# Patient Record
Sex: Male | Born: 1955 | Race: White | Hispanic: No | Marital: Married | Smoking: Never smoker
Health system: Southern US, Community
[De-identification: ages and names within clinical notes are randomized; demographics above are authoritative.]

## PROBLEM LIST (undated history)

## (undated) HISTORY — PX: FRACTURE SURGERY: SHX138

---

## 2015-03-31 ENCOUNTER — Emergency Department (HOSPITAL_COMMUNITY): Payer: Self-pay

## 2015-03-31 ENCOUNTER — Emergency Department (HOSPITAL_COMMUNITY)
Admission: EM | Admit: 2015-03-31 | Discharge: 2015-04-01 | Disposition: A | Discharge status: Home / Self Care | Payer: Self-pay | Attending: Emergency Medicine | Admitting: Emergency Medicine

## 2015-03-31 ENCOUNTER — Encounter (HOSPITAL_COMMUNITY): Payer: Self-pay | Admitting: Emergency Medicine

## 2015-03-31 DIAGNOSIS — R319 Hematuria, unspecified: Secondary | ICD-10-CM

## 2015-03-31 DIAGNOSIS — K5900 Constipation, unspecified: Secondary | ICD-10-CM | POA: Insufficient documentation

## 2015-03-31 DIAGNOSIS — N3001 Acute cystitis with hematuria: Secondary | ICD-10-CM | POA: Insufficient documentation

## 2015-03-31 LAB — URINALYSIS, ROUTINE W REFLEX MICROSCOPIC
GLUCOSE, UA: NEGATIVE mg/dL
KETONES UR: 40 mg/dL — AB
Nitrite: NEGATIVE
PH: 7 (ref 5.0–8.0)
Protein, ur: 100 mg/dL — AB
SPECIFIC GRAVITY, URINE: 1.023 (ref 1.005–1.030)

## 2015-03-31 LAB — BASIC METABOLIC PANEL
Anion gap: 13 (ref 5–15)
BUN: 14 mg/dL (ref 6–20)
CHLORIDE: 98 mmol/L — AB (ref 101–111)
CO2: 24 mmol/L (ref 22–32)
CREATININE: 1.16 mg/dL (ref 0.61–1.24)
Calcium: 9.9 mg/dL (ref 8.9–10.3)
GFR calc Af Amer: >60 mL/min (ref >60)
GLUCOSE: 156 mg/dL — AB (ref 65–99)
POTASSIUM: 4 mmol/L (ref 3.5–5.1)
SODIUM: 135 mmol/L (ref 135–145)

## 2015-03-31 LAB — CBC
HCT: 48.1 % (ref 39.0–52.0)
Hemoglobin: 16.9 g/dL (ref 13.0–17.0)
MCH: 33.2 pg (ref 26.0–34.0)
MCHC: 35.1 g/dL (ref 30.0–36.0)
MCV: 94.5 fL (ref 78.0–100.0)
PLATELETS: 163 10*3/uL (ref 150–400)
RBC: 5.09 MIL/uL (ref 4.22–5.81)
RDW: 13 % (ref 11.5–15.5)
WBC: 24.8 10*3/uL — ABNORMAL HIGH (ref 4.0–10.5)

## 2015-03-31 LAB — URINE MICROSCOPIC-ADD ON: SQUAMOUS EPITHELIAL / LPF: NONE SEEN

## 2015-03-31 MED ORDER — CEPHALEXIN 500 MG PO CAPS: 500.0000 mg | ORAL_CAPSULE | Freq: Three times a day (TID) | ORAL | Status: AC

## 2015-03-31 MED ORDER — DEXTROSE 5 % IV SOLN
1.0000 g | Freq: Once | INTRAVENOUS | Status: AC
  Administered 2015-03-31: 1 g via INTRAVENOUS
  Filled 2015-03-31: qty 10

## 2015-03-31 MED ORDER — SODIUM CHLORIDE 0.9 % IV BOLUS (SEPSIS)
1000.0000 mL | Freq: Once | INTRAVENOUS | Status: AC
  Administered 2015-03-31: 1000 mL via INTRAVENOUS

## 2015-03-31 NOTE — ED Notes (Addendum)
Pt from [place] 1 day in the states. Hx of UTI. Started having dysuria yesterday and was not feeling well. Pt reports blood in his urine today with pain. Pt has n/v. Pt unsure if he has had fever or diarrhea. Last BM was Friday.

## 2015-03-31 NOTE — ED Provider Notes (Signed)
CSN: [id number]     Arrival date & time 03/31/15  2049 History   First MD Initiated Contact with Patient 03/31/15 2213     Chief Complaint  Patient presents with  . Nausea  . Emesis  . Hematuria    HPI   Lucas Kelley is a 60 y.o. male with a PMH of UTI who presents to the ED with dysuria, urgency, frequency, hematuria, and generalized body aches, which he states started yesterday and has been constant since that time. He denies exacerbating factors. He states he took 2 augmentin at home for symptom relief. He reports associated subjective fever, nausea, 2 episodes of emesis, and constipation (last BM 2 days ago). Denies abdominal pain, hematemesis, hematochezia, melena, penile pain/swelling/discharge, testicular pain/swelling.   History reviewed. No pertinent past medical history. Past Surgical History  Procedure Laterality Date  . Fracture surgery     No family history on file. Social History  Substance Use Topics  . Smoking status: Never Smoker   . Smokeless tobacco: None  . Alcohol Use: Yes     Review of Systems  Constitutional: Positive for fever. Negative for chills.  HENT: Negative for congestion.   Respiratory: Negative for cough and shortness of breath.   Cardiovascular: Negative for chest pain.  Gastrointestinal: Positive for nausea, vomiting and constipation. Negative for abdominal pain, diarrhea and blood in stool.  Genitourinary: Positive for dysuria, urgency, frequency and hematuria. Negative for flank pain, discharge, penile swelling, scrotal swelling, penile pain and testicular pain.  Musculoskeletal: Positive for myalgias and arthralgias.  All other systems reviewed and are negative.     Allergies  Review of patient's allergies indicates no known allergies.  Home Medications   Prior to Admission medications   Medication Sig Start Date End Date Taking? Authorizing Provider  amoxicillin-clavulanate (AUGMENTIN XR) 1000-62.5 MG 12 hr tablet Take 2 tablets  by mouth 2 (two) times daily. Patient is from [place] and buys this OTC so there is no specific amount of time he is to take,but he started today 03/31/2015.   Yes Historical Provider, MD  cephALEXin (KEFLEX) 500 MG capsule Take 1 capsule (500 mg total) by mouth 3 (three) times daily. 03/31/15   Mady Gemma, PA-C  ibuprofen (ADVIL,MOTRIN) 200 MG tablet Take 400 mg by mouth every 6 (six) hours as needed for moderate pain.   Yes Historical Provider, MD  pseudoephedrine-acetaminophen (TYLENOL SINUS) 30-500 MG TABS tablet Take 2 tablets by mouth every 4 (four) hours as needed (for cold/flu).   Yes Historical Provider, MD    BP 139/90 mmHg  Pulse 116  Temp(Src) 98.4 F (36.9 C) (Oral)  Resp 20  SpO2 99% Physical Exam  Constitutional: He is oriented to person, place, and time. He appears well-developed and well-nourished. No distress.  HENT:  Head: Normocephalic and atraumatic.  Right Ear: External ear normal.  Left Ear: External ear normal.  Nose: Nose normal.  Mouth/Throat: Uvula is midline, oropharynx is clear and moist and mucous membranes are normal.  Eyes: Conjunctivae, EOM and lids are normal. Pupils are equal, round, and reactive to light. Right eye exhibits no discharge. Left eye exhibits no discharge. No scleral icterus.  Neck: Normal range of motion. Neck supple.  Cardiovascular: Normal rate, regular rhythm, normal heart sounds, intact distal pulses and normal pulses.   Pulmonary/Chest: Effort normal and breath sounds normal. No respiratory distress. He has no wheezes. He has no rales.  Abdominal: Soft. Normal appearance and bowel sounds are normal. He exhibits no distension  and no mass. There is no tenderness. There is no rigidity, no rebound, no guarding and no CVA tenderness.  Musculoskeletal: Normal range of motion. He exhibits no edema or tenderness.  Neurological: He is alert and oriented to person, place, and time.  Skin: Skin is warm, dry and intact. No rash noted.  He is not diaphoretic. No erythema. No pallor.  Psychiatric: He has a normal mood and affect. His speech is normal and behavior is normal.  Nursing note and vitals reviewed.   ED Course  Procedures (including critical care time)  Labs Review Labs Reviewed  URINALYSIS, ROUTINE W REFLEX MICROSCOPIC (NOT AT [facility]) - Abnormal; Notable for the following:    Color, Urine AMBER (*)    APPearance CLOUDY (*)    Hgb urine dipstick SMALL (*)    Bilirubin Urine SMALL (*)    Ketones, ur 40 (*)    Protein, ur 100 (*)    Leukocytes, UA LARGE (*)    All other components within normal limits  CBC - Abnormal; Notable for the following:    WBC 24.8 (*)    All other components within normal limits  BASIC METABOLIC PANEL - Abnormal; Notable for the following:    Chloride 98 (*)    Glucose, Bld 156 (*)    All other components within normal limits  URINE MICROSCOPIC-ADD ON - Abnormal; Notable for the following:    Bacteria, UA FEW (*)    All other components within normal limits  URINE CULTURE    Imaging Review Ct Abdomen Pelvis Wo Contrast  03/31/2015  CLINICAL DATA:  Dysuria beginning yesterday. Blood in the urine with pain. Nausea and vomiting. EXAM: CT ABDOMEN AND PELVIS WITHOUT CONTRAST TECHNIQUE: Multidetector CT imaging of the abdomen and pelvis was performed following the standard protocol without IV contrast. COMPARISON:  None. FINDINGS: Slight fibrosis in the lung bases. Kidneys appear symmetrical in size and shape. No hydronephrosis or hydroureter. No renal, ureteral, or bladder stones. Mild bladder wall thickening may indicate cystitis. Mild diffuse fatty infiltration of the liver. Cholelithiasis without inflammatory change around the gallbladder. Unenhanced appearance of the pancreas, spleen, adrenal glands, inferior vena cava, and retroperitoneal lymph nodes is unremarkable. Calcification of the abdominal aorta without aneurysm. Stomach, small bowel, and colon are not abnormally distended. No  free air or free fluid in the abdomen. Abdominal wall musculature appears intact. Pelvis: Whole appendix is normal. Prostate gland is enlarged measuring 5.4 cm transverse diameter. No free or loculated pelvic fluid collections. No pelvic mass or lymphadenopathy. Mild stranding in the lower pelvic fat particularly on the left may indicate infectious or inflammatory change. Degenerative changes in the spine. IMPRESSION: No renal or ureteral stone or obstruction. Bladder wall thickening may indicate cystitis. Infiltration in the low pelvic fat may be due to infectious or inflammatory process. Prostate enlargement. Diffuse fatty infiltration of the liver. Cholelithiasis. Electronically Signed   By: Burman Nieves M.D.   On: 03/31/2015 23:37     I have personally reviewed and evaluated these images and lab results as part of my medical decision-making.   EKG Interpretation None      MDM   Final diagnoses:  Hematuria  Acute cystitis with hematuria    60 year old male presents with dysuria, urgency, frequency, hematuria, and generalized body aches since yesterday. Reports associated subjective fever, nausea, 2 episodes of emesis, and constipation (last BM 2 days ago). Denies abdominal pain, hematemesis, hematochezia, melena, penile pain/swelling/discharge, testicular pain/swelling.  Patient is afebrile. Tachycardic to  116. Abdomen soft, non-tender, non-distended. No rebound, guarding, or masses. No CVA tenderness.  CBC remarkable for leukocytosis of 24.8. BMP unremarkable, creatinine within normal limits. UA remarkable for large leukocytes, small hemoglobin. Urine microscopic remarkable for TNTC WBC, few bacteria. Urine culture ordered. CT abdomen pelvis negative for renal or ureteral stone or obstruction, bladder wall thickening may indicate cystitis.  Given fluids and rocephin in the ED. Patient discussed with and seen by Dr. Hyacinth Meeker. Recommended admission, however patient refused, as he states he  is flying home to [place] tomorrow. Patient is non-toxic and well-appearing, feel he is stable for discharge at this time. Will treat with keflex. Patient understands his urine culture is pending and that he should schedule an appointment to follow-up with urology. Return precautions discussed. Patient verbalizes his understanding and is in agreement with plan.  BP 139/90 mmHg  Pulse 116  Temp(Src) 98.4 F (36.9 C) (Oral)  Resp 20  SpO2 99%    Mady Gemma, PA-C 04/01/15 0026  Eber Hong, MD 04/01/15 1331

## 2015-03-31 NOTE — ED Provider Notes (Signed)
The patient is a 60 year old male, he has a history of a couple of urinary infections in the past, states that he started having some dysuria yesterday, he was not feeling well I can't the flu with body aches and fevers. He presents here after having nausea and vomiting today. On exam the patient has a soft abdomen, totally nontender, his heart rate is now close to 100 though he was initially tachycardic. His lower extremity is without edema, his heart and lung sounds are normal, his mental status is totally normal. Labs reviewed, UTI present, white blood cells significantly elevated at 25,000. I have this conversation with the patient regarding his prior visits, prior UTI and prior leukocytosis she states was very similar. He does not appear toxic, I have offered him and recommended admission overnight but he has declined stating that he did travel back to [place] - I admitted the patient aware of the urinary culture, however works and how he will get follow-up if he needs it for medicine, will prescribe Keflex. He received Rocephin and fluids in the emergency department and states he feels 100% better.  In addition to written d/c instructions, the pt was given verbal d/c instructions including the indications for return and expressed understanding to the instructions.  Medical screening examination/treatment/procedure(s) were conducted as a shared visit with non-physician practitioner(s) and myself.  I personally evaluated the patient during the encounter.  Clinical Impression:   Final diagnoses:  Hematuria  Acute cystitis with hematuria         Eber Hong, MD 04/01/15 1331

## 2015-03-31 NOTE — Discharge Instructions (Signed)
1. Medications: keflex, usual home medications 2. Treatment: rest, drink plenty of fluids 3. Follow Up: please followup with urology for discussion of your diagnoses and further evaluation after today's visit; if you do not have a primary care doctor use the resource guide provided to find one; please return to the ER for high fever, severe pain, new or worsening symptoms   Urinary Tract Infection Urinary tract infections (UTIs) can develop anywhere along your urinary tract. Your urinary tract is your body's drainage system for removing wastes and extra water. Your urinary tract includes two kidneys, two ureters, a bladder, and a urethra. Your kidneys are a pair of bean-shaped organs. Each kidney is about the size of your fist. They are located below your ribs, one on each side of your spine. CAUSES Infections are caused by microbes, which are microscopic organisms, including fungi, viruses, and bacteria. These organisms are so small that they can only be seen through a microscope. Bacteria are the microbes that most commonly cause UTIs. SYMPTOMS  Symptoms of UTIs may vary by age and gender of the patient and by the location of the infection. Symptoms in young women typically include a frequent and intense urge to urinate and a painful, burning feeling in the bladder or urethra during urination. Older women and men are more likely to be tired, shaky, and weak and have muscle aches and abdominal pain. A fever may mean the infection is in your kidneys. Other symptoms of a kidney infection include pain in your back or sides below the ribs, nausea, and vomiting. DIAGNOSIS To diagnose a UTI, your caregiver will ask you about your symptoms. Your caregiver will also ask you to provide a urine sample. The urine sample will be tested for bacteria and white blood cells. White blood cells are made by your body to help fight infection. TREATMENT  Typically, UTIs can be treated with medication. Because most UTIs  are caused by a bacterial infection, they usually can be treated with the use of antibiotics. The choice of antibiotic and length of treatment depend on your symptoms and the type of bacteria causing your infection. HOME CARE INSTRUCTIONS  If you were prescribed antibiotics, take them exactly as your caregiver instructs you. Finish the medication even if you feel better after you have only taken some of the medication.  Drink enough water and fluids to keep your urine clear or pale yellow.  Avoid caffeine, tea, and carbonated beverages. They tend to irritate your bladder.  Empty your bladder often. Avoid holding urine for long periods of time.  Empty your bladder before and after sexual intercourse.  After a bowel movement, women should cleanse from front to back. Use each tissue only once. SEEK MEDICAL CARE IF:   You have back pain.  You develop a fever.  Your symptoms do not begin to resolve within 3 days. SEEK IMMEDIATE MEDICAL CARE IF:   You have severe back pain or lower abdominal pain.  You develop chills.  You have nausea or vomiting.  You have continued burning or discomfort with urination. MAKE SURE YOU:   Understand these instructions.  Will watch your condition.  Will get help right away if you are not doing well or get worse.   This information is not intended to replace advice given to you by your health care provider. Make sure you discuss any questions you have with your health care provider.   Document Released: 12/03/2004 Document Revised: 11/14/2014 Document Reviewed: 04/03/2011 Elsevier Interactive Patient Education  2016 [place].

## 2015-04-03 LAB — URINE CULTURE: Culture: 30000

## 2015-04-04 ENCOUNTER — Telehealth (HOSPITAL_BASED_OUTPATIENT_CLINIC_OR_DEPARTMENT_OTHER): Payer: Self-pay | Admitting: Emergency Medicine

## 2015-04-04 NOTE — Telephone Encounter (Signed)
Post ED Visit - Positive Culture Follow-up  Culture report reviewed by antimicrobial stewardship pharmacist:   Enzo Bi, Pharm.D.  Celedonio Miyamoto, [address].D., BCPS  Garvin Fila, Pharm.D.  Georgina Pillion, Pharm.D., BCPS  [place], [address].D., BCPS, AAHIVP  Estella Husk, Pharm.D., BCPS, AAHIVP  Tennis Must, [address].D.  Sherle Poe, [address].D.  Positive urine culture E. coli Treated with augmentin, cephalexin, organism sensitive to the same and no further patient follow-up is required at this time.  Berle Mull 04/04/2015, 9:52 AM

## 2017-02-21 IMAGING — CT CT ABD-PELV W/O CM
2 of 3 series · 16 of 42 positions shown, 18 images · non-contrast
Comparison: None.

CLINICAL DATA: Dysuria beginning yesterday. Blood in the urine with
pain. Nausea and vomiting.

EXAM:
CT ABDOMEN AND PELVIS WITHOUT CONTRAST
TECHNIQUE: Multidetector CT imaging of the abdomen and pelvis was performed
following the standard protocol without IV contrast.

[Series 3: coronal · coronal · 0.74mm/px · 3 of 127 slices shown]
[im 43/127  soft-tissue]
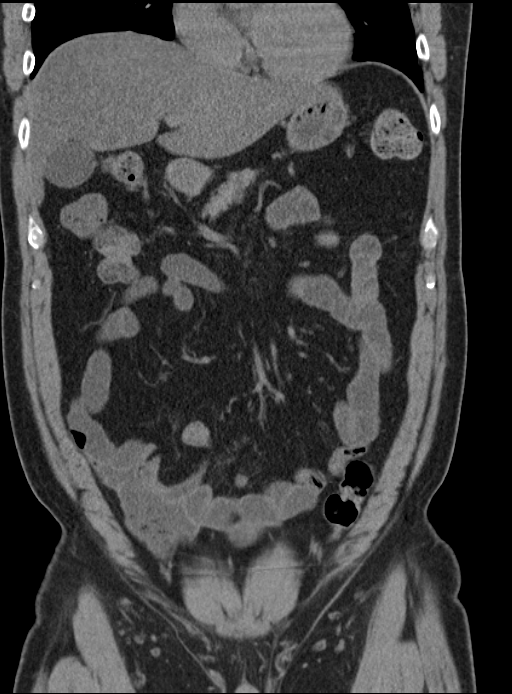
[im 57/127  soft-tissue]
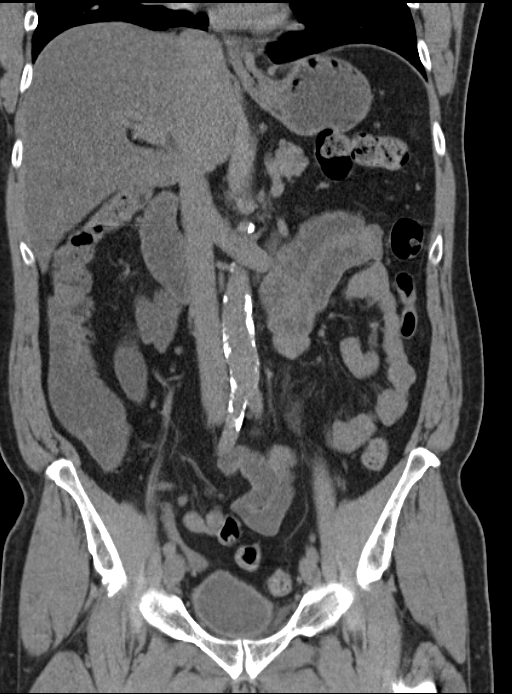
[im 71/127  soft-tissue]
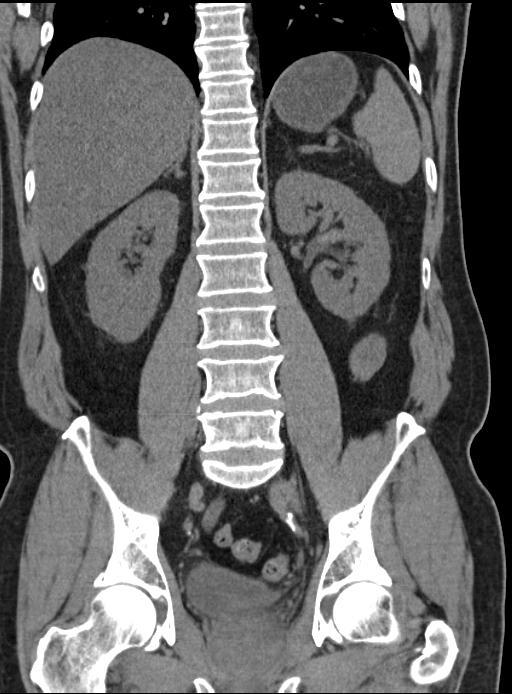

[Series 6: lung · axial · 0.82mm/px · z∈[+1948,+2068]mm · 13 of 28 slices shown, 15 images]
[im 3/28  soft-tissue]
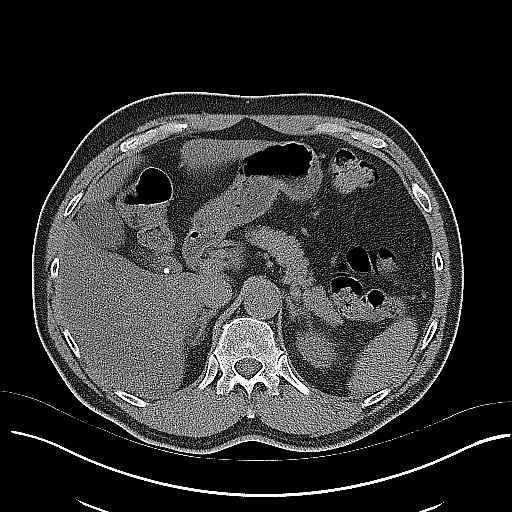
[im 3/28  bone]
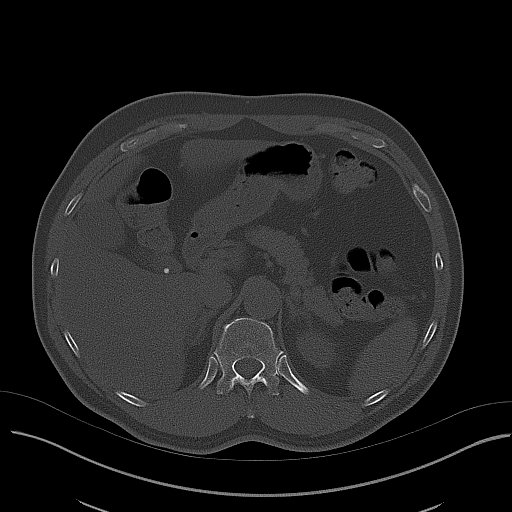
[im 5/28  soft-tissue]
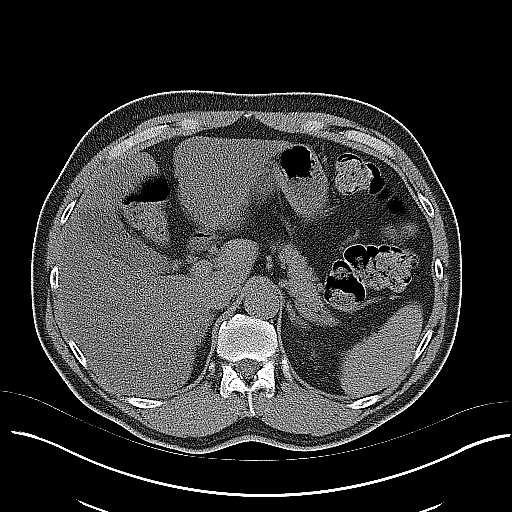
[im 7/28  soft-tissue]
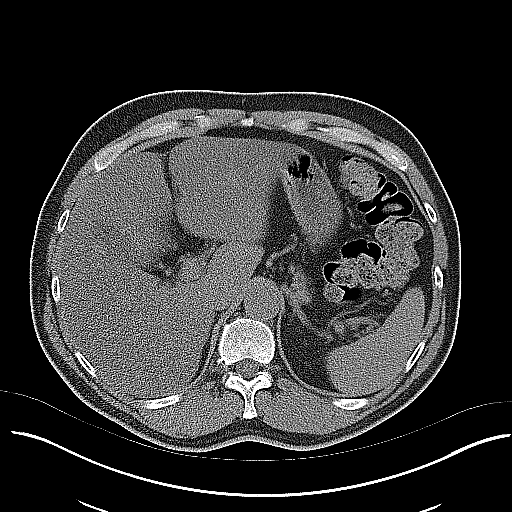
[im 9/28  soft-tissue]
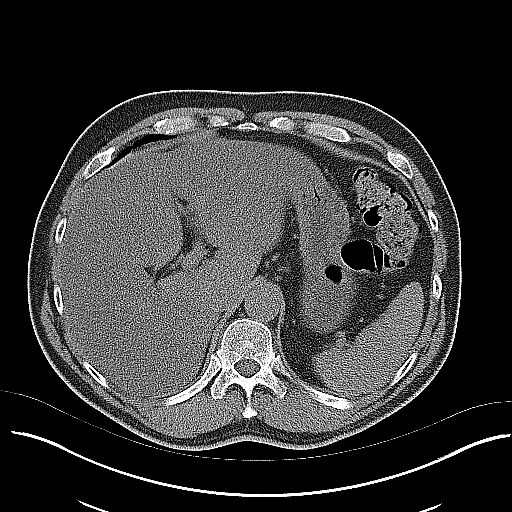
[im 11/28  soft-tissue]
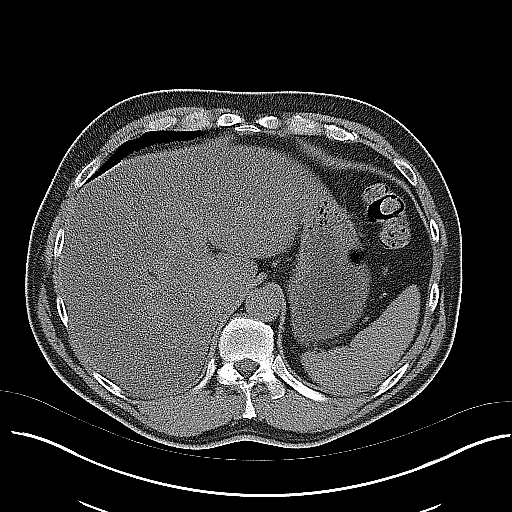
[im 13/28  soft-tissue]
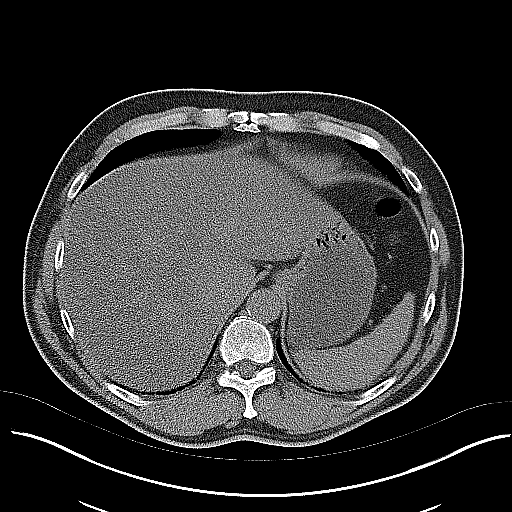
[im 15/28  soft-tissue]
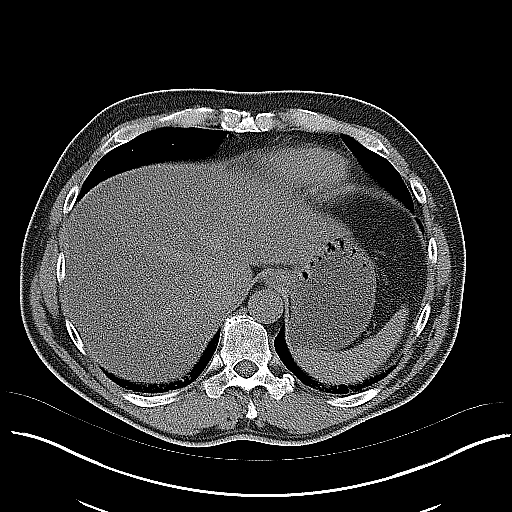
[im 17/28  soft-tissue]
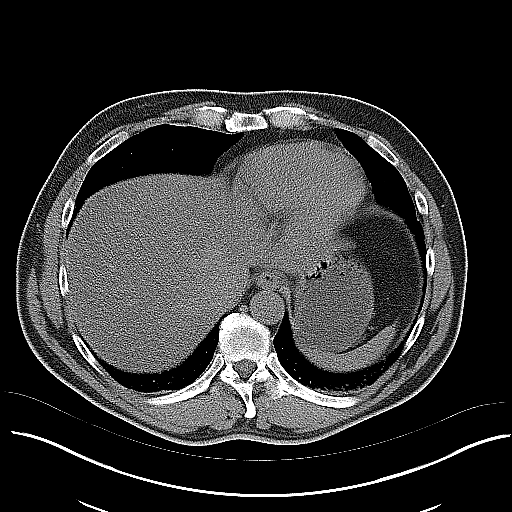
[im 19/28  soft-tissue]
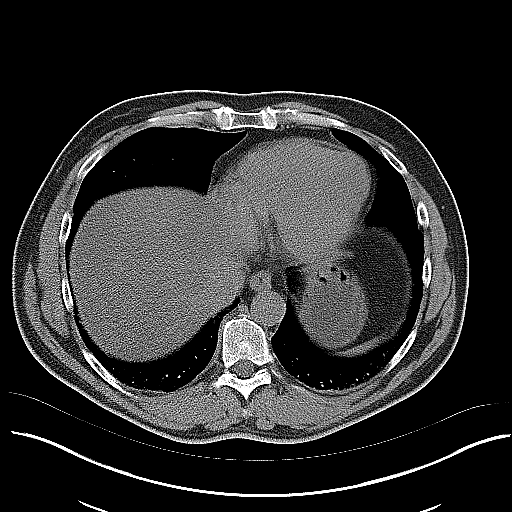
[im 19/28  bone]
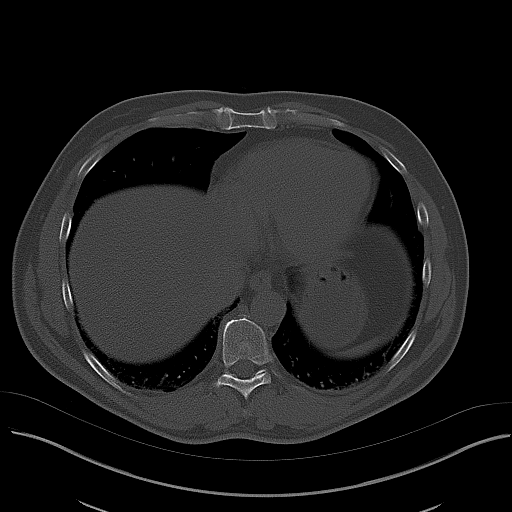
[im 21/28  soft-tissue]
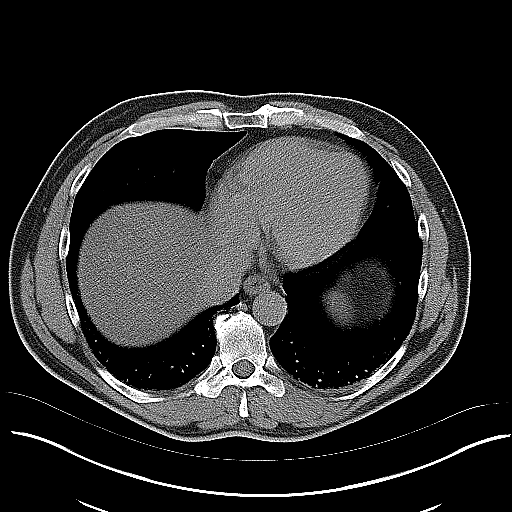
[im 23/28  soft-tissue]
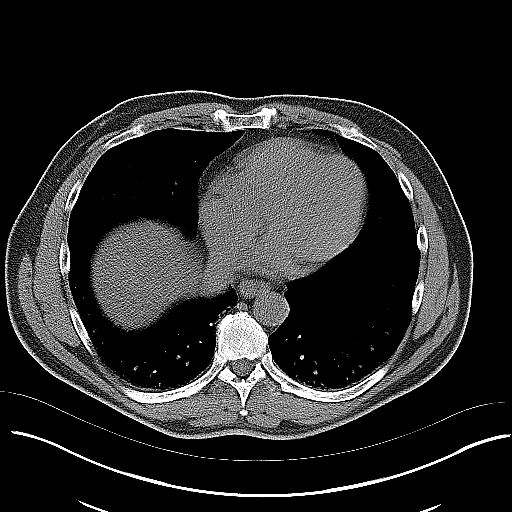
[im 25/28  soft-tissue]
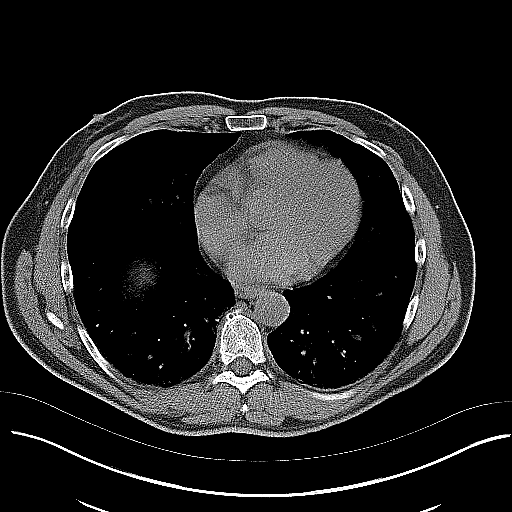
[im 27/28  soft-tissue]
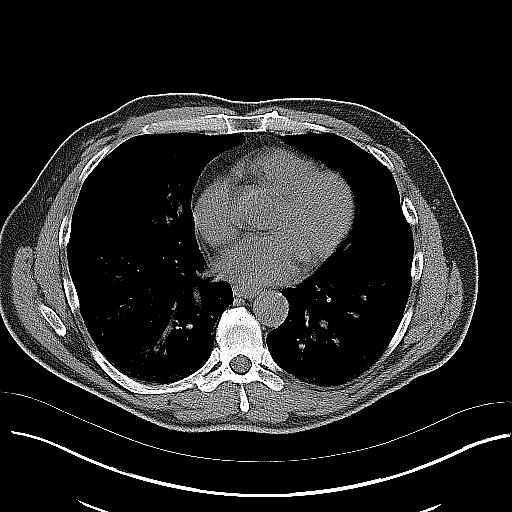

[16 of 42 positions shown; findings below may reference images not displayed]

FINDINGS: Slight fibrosis in the lung bases.

Kidneys appear symmetrical in size and shape. No hydronephrosis or
hydroureter. No renal, ureteral, or bladder stones. Mild bladder
wall thickening may indicate cystitis.

Mild diffuse fatty infiltration of the liver. Cholelithiasis without
inflammatory change around the gallbladder. Unenhanced appearance of
the pancreas, spleen, adrenal glands, inferior vena cava, and
retroperitoneal lymph nodes is unremarkable. Calcification of the
abdominal aorta without aneurysm.

Stomach, small bowel, and colon are not abnormally distended. No
free air or free fluid in the abdomen. Abdominal wall musculature
appears intact.

Pelvis: Whole appendix is normal. Prostate gland is enlarged
measuring 5.4 cm transverse diameter. No free or loculated pelvic
fluid collections. No pelvic mass or lymphadenopathy. Mild stranding
in the lower pelvic fat particularly on the left may indicate
infectious or inflammatory change. Degenerative changes in the
spine.
IMPRESSION: No renal or ureteral stone or obstruction. Bladder wall thickening
may indicate cystitis. Infiltration in the low pelvic fat may be due
to infectious or inflammatory process. Prostate enlargement. Diffuse
fatty infiltration of the liver. Cholelithiasis.
